# Patient Record
Sex: Male | Born: 1995 | Race: Black or African American | Hispanic: No | Marital: Single | Smoking: Former smoker
Health system: Southern US, Community
[De-identification: ages and names within clinical notes are randomized; demographics above are authoritative.]

## PROBLEM LIST (undated history)

## (undated) DIAGNOSIS — T7840XA Allergy, unspecified, initial encounter: Secondary | ICD-10-CM

## (undated) DIAGNOSIS — J45909 Unspecified asthma, uncomplicated: Secondary | ICD-10-CM

## (undated) HISTORY — DX: Allergy, unspecified, initial encounter: T78.40XA

---
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
## 2019-06-05 ENCOUNTER — Other Ambulatory Visit: Payer: Self-pay | Admitting: *Deleted

## 2019-06-05 DIAGNOSIS — Z20822 Contact with and (suspected) exposure to covid-19: Secondary | ICD-10-CM

## 2019-06-10 LAB — NOVEL CORONAVIRUS, NAA: SARS-CoV-2, NAA: NOT DETECTED

## 2022-07-07 ENCOUNTER — Encounter (HOSPITAL_BASED_OUTPATIENT_CLINIC_OR_DEPARTMENT_OTHER): Payer: Self-pay | Admitting: Emergency Medicine

## 2022-07-07 ENCOUNTER — Emergency Department (HOSPITAL_BASED_OUTPATIENT_CLINIC_OR_DEPARTMENT_OTHER)
Admission: EM | Admit: 2022-07-07 | Discharge: 2022-07-07 | Disposition: A | Discharge status: Home / Self Care | Payer: Self-pay | Attending: Emergency Medicine | Admitting: Emergency Medicine

## 2022-07-07 ENCOUNTER — Other Ambulatory Visit: Payer: Self-pay

## 2022-07-07 DIAGNOSIS — F17210 Nicotine dependence, cigarettes, uncomplicated: Secondary | ICD-10-CM | POA: Insufficient documentation

## 2022-07-07 DIAGNOSIS — R1013 Epigastric pain: Secondary | ICD-10-CM | POA: Insufficient documentation

## 2022-07-07 HISTORY — DX: Unspecified asthma, uncomplicated: J45.909

## 2022-07-07 LAB — COMPREHENSIVE METABOLIC PANEL
ALT: 31 U/L (ref 0–44)
AST: 26 U/L (ref 15–41)
Albumin: 4.8 g/dL (ref 3.5–5.0)
Alkaline Phosphatase: 45 U/L (ref 38–126)
Anion gap: 10 (ref 5–15)
BUN: 15 mg/dL (ref 6–20)
CO2: 27 mmol/L (ref 22–32)
Calcium: 9.8 mg/dL (ref 8.9–10.3)
Chloride: 104 mmol/L (ref 98–111)
Creatinine, Ser: 0.97 mg/dL (ref 0.61–1.24)
GFR, Estimated: >60 mL/min (ref >60)
Glucose, Bld: 93 mg/dL (ref 70–99)
Potassium: 3.6 mmol/L (ref 3.5–5.1)
Sodium: 141 mmol/L (ref 135–145)
Total Bilirubin: 0.6 mg/dL (ref 0.3–1.2)
Total Protein: 8.2 g/dL — ABNORMAL HIGH (ref 6.5–8.1)

## 2022-07-07 LAB — CBC
HCT: 43.9 % (ref 39.0–52.0)
Hemoglobin: 14.2 g/dL (ref 13.0–17.0)
MCH: 25.6 pg — ABNORMAL LOW (ref 26.0–34.0)
MCHC: 32.3 g/dL (ref 30.0–36.0)
MCV: 79.1 fL — ABNORMAL LOW (ref 80.0–100.0)
Platelets: 344 10*3/uL (ref 150–400)
RBC: 5.55 MIL/uL (ref 4.22–5.81)
RDW: 14.8 % (ref 11.5–15.5)
WBC: 7.9 10*3/uL (ref 4.0–10.5)
nRBC: 0 % (ref 0.0–0.2)

## 2022-07-07 LAB — LIPASE, BLOOD: Lipase: 20 U/L (ref 11–51)

## 2022-07-07 MED ORDER — SUCRALFATE 1 G PO TABS: 1.0000 g | ORAL_TABLET | Freq: Three times a day (TID) | ORAL | 0 refills | Status: DC

## 2022-07-07 MED ORDER — ESOMEPRAZOLE MAGNESIUM 40 MG PO CPDR: DELAYED_RELEASE_CAPSULE | ORAL | 0 refills | Status: DC

## 2022-07-07 MED ORDER — ESOMEPRAZOLE MAGNESIUM 40 MG PO CPDR: DELAYED_RELEASE_CAPSULE | ORAL | 0 refills | Status: AC

## 2022-07-07 MED ORDER — PANTOPRAZOLE SODIUM 40 MG PO TBEC
40.0000 mg | DELAYED_RELEASE_TABLET | Freq: Once | ORAL | Status: AC
  Administered 2022-07-07: 40 mg via ORAL
  Filled 2022-07-07: qty 1

## 2022-07-07 NOTE — ED Triage Notes (Signed)
Patient endorses generalized abd pain x 1 week.  Patient denies n/v/d.

## 2022-07-07 NOTE — ED Provider Notes (Signed)
[facility] Provider Note: Lowella Dell, MD, FACEP  CSN: [id number] MRN: [id number] ARRIVAL: 07/07/22 at 0031 ROOM: DB016/DB016   CHIEF COMPLAINT  Abdominal Pain   HISTORY OF PRESENT ILLNESS  07/07/22 1:43 AM Johnny Santos is a 26 y.o. male about a week of epigastric discomfort.  He has difficulty describing it but it is not a frank pain.  He rates his discomfort as a 5 out of 10.  There is no associated nausea, vomiting or diarrhea.  It is not worse or better with food.  It worsened overnight and so he decided come to the ED.   Past Medical History:  Diagnosis Date   Asthma     History reviewed. No pertinent surgical history.  History reviewed. No pertinent family history.  Social History   Tobacco Use   Smoking status: Some Days    Types: Cigars   Smokeless tobacco: Never  Substance Use Topics   Alcohol use: Yes   Drug use: Never    Prior to Admission medications   Medication Sig Start Date End Date Taking? Authorizing Provider  esomeprazole (NEXIUM) 40 MG capsule Take 1 capsule daily at least 30 minutes before first dose of Carafate. 07/07/22  Yes Tresean Mattix, MD  sucralfate (CARAFATE) 1 g tablet Take 1 tablet (1 g total) by mouth 4 (four) times daily -  with meals and at bedtime. 07/07/22  Yes Isatu Macinnes, MD    Allergies Pollen extract   REVIEW OF SYSTEMS  Negative except as noted here or in the History of Present Illness.   PHYSICAL EXAMINATION  Initial Vital Signs Blood pressure (!) 156/106, pulse 95, temperature 99.4 F (37.4 C), temperature source Oral, resp. rate 20, height 6\' 2"  (1.88 m), weight (!) 142.9 kg, SpO2 100 %.  Examination General: Well-developed, well-nourished male in no acute distress; appearance consistent with age of record HENT: normocephalic; atraumatic Eyes: Normal appearance Neck: supple Heart: regular rate and rhythm Lungs: clear to auscultation bilaterally Abdomen: soft; nondistended; minimal left upper quadrant  tenderness; bowel sounds present Extremities: No deformity; full range of motion; pulses normal Neurologic: Awake, alert and oriented; motor function intact in all extremities and symmetric; no facial droop Skin: Warm and dry Psychiatric: Normal mood and affect   RESULTS  Summary of this visit's results, reviewed and interpreted by myself:   EKG Interpretation  Date/Time:    Ventricular Rate:    PR Interval:    QRS Duration:   QT Interval:    QTC Calculation:   R Axis:     Text Interpretation:         Laboratory Studies: Results for orders placed or performed during the hospital encounter of 07/07/22 (from the past 24 hour(s))  Lipase, blood     Status: None   Collection Time: 07/07/22 12:50 AM  Result Value Ref Range   Lipase 20 11 - 51 U/L  Comprehensive metabolic panel     Status: Abnormal   Collection Time: 07/07/22 12:50 AM  Result Value Ref Range   Sodium 141 135 - 145 mmol/L   Potassium 3.6 3.5 - 5.1 mmol/L   Chloride 104 98 - 111 mmol/L   CO2 27 22 - 32 mmol/L   Glucose, Bld 93 70 - 99 mg/dL   BUN 15 6 - 20 mg/dL   Creatinine, Ser 09/06/22 0.61 - 1.24 mg/dL   Calcium 9.8 8.9 - 2.44 mg/dL   Total Protein 8.2 (H) 6.5 - 8.1 g/dL   Albumin 4.8 3.5 -  5.0 g/dL   AST 26 15 - 41 U/L   ALT 31 0 - 44 U/L   Alkaline Phosphatase 45 38 - 126 U/L   Total Bilirubin 0.6 0.3 - 1.2 mg/dL   GFR, Estimated >11 >91 mL/min   Anion gap 10 5 - 15  CBC     Status: Abnormal   Collection Time: 07/07/22 12:50 AM  Result Value Ref Range   WBC 7.9 4.0 - 10.5 K/uL   RBC 5.55 4.22 - 5.81 MIL/uL   Hemoglobin 14.2 13.0 - 17.0 g/dL   HCT 47.8 29.5 - 62.1 %   MCV 79.1 (L) 80.0 - 100.0 fL   MCH 25.6 (L) 26.0 - 34.0 pg   MCHC 32.3 30.0 - 36.0 g/dL   RDW 30.8 65.7 - 84.6 %   Platelets 344 150 - 400 K/uL   nRBC 0.0 0.0 - 0.2 %   Imaging Studies: No results found.  ED COURSE and MDM  Nursing notes, initial and subsequent vitals signs, including pulse oximetry, reviewed and interpreted  by myself.  Vitals:   07/07/22 0045 07/07/22 0145  BP: (!) 156/106 (!) 141/92  Pulse: 95 80  Resp: 20 18  Temp: 99.4 F (37.4 C)   TempSrc: Oral   SpO2: 100% 97%  Weight: (!) 142.9 kg   Height: 6\' 2"  (1.88 m)    Medications  pantoprazole (PROTONIX) EC tablet 40 mg (has no administration in time range)   The patient has minimal left upper quadrant tenderness and unremarkable labs (he was advised of slightly low MCV and encouraged to increase his iron intake).  I suspect his symptoms are due to gastritis and/or GERD.  Will start him on a PPI and Carafate.   PROCEDURES  Procedures   ED DIAGNOSES     ICD-10-CM   1. Epigastric abdominal pain  R10.13          Johnny Santos, , MD 07/07/22 [phone]

## 2022-11-23 ENCOUNTER — Ambulatory Visit
Admission: EM | Admit: 2022-11-23 | Discharge: 2022-11-23 | Disposition: A | Discharge status: Home / Self Care | Payer: Self-pay | Attending: Urgent Care | Admitting: Urgent Care

## 2022-11-23 DIAGNOSIS — R519 Headache, unspecified: Secondary | ICD-10-CM

## 2022-11-23 DIAGNOSIS — G8929 Other chronic pain: Secondary | ICD-10-CM

## 2022-11-23 MED ORDER — FLUTICASONE PROPIONATE 50 MCG/ACT NA SUSP: 2.0000 | Freq: Every day | NASAL | 12 refills | Status: DC

## 2022-11-23 MED ORDER — CETIRIZINE HCL 10 MG PO TABS: 10.0000 mg | ORAL_TABLET | Freq: Every day | ORAL | 0 refills | Status: AC

## 2022-11-23 MED ORDER — PSEUDOEPHEDRINE HCL 60 MG PO TABS: 60.0000 mg | ORAL_TABLET | Freq: Three times a day (TID) | ORAL | 0 refills | Status: DC | PRN

## 2022-11-23 NOTE — ED Triage Notes (Signed)
Pt c/o HA "most of my life"-had HA and vomited x 2 on 12/24-felt better after vomiting states he felt better-states family checked BP "150s"-denies hx of dx migraine and HTN-NAD-steady gait

## 2022-11-23 NOTE — ED Provider Notes (Signed)
[facility]  Note:  This document was prepared using Conservation officer, historic buildings and may include unintentional dictation errors.  MRN: [id number] DOB: November 13, 1996  Subjective:   Johnny Santos is a 26 y.o. male presenting for 3-4 day history of acute on chronic persistent intermittent headache that elicited vomiting. Has longstanding history of headaches. Currently has right frontal headache, sinus congestion, drainage. He is mostly concerned because on 11/19/2022 he had a severe headache and checked his blood pressure which was consistently in the 140s-150s systolic. No history of HTN. Does not take blood pressure medications. Has never seen a headache specialist, neurologist. Does not hydrate well. Generally eats regular meals. Sleeps 6-8 hours a night consistently.   No current facility-administered medications for this encounter.  Current Outpatient Medications:    esomeprazole (NEXIUM) 40 MG capsule, Take 1 capsule daily at least 30 minutes before first dose of Carafate., Disp: 30 capsule, Rfl: 0   sucralfate (CARAFATE) 1 g tablet, Take 1 tablet (1 g total) by mouth 4 (four) times daily -  with meals and at bedtime., Disp: 40 tablet, Rfl: 0   Allergies  Allergen Reactions   Pollen Extract     Past Medical History:  Diagnosis Date   Asthma      History reviewed. No pertinent surgical history.  No family history on file.  Social History   Tobacco Use   Smoking status: Some Days    Types: Cigars   Smokeless tobacco: Never  Vaping Use   Vaping Use: Never used  Substance Use Topics   Alcohol use: Yes    Comment: occ   Drug use: Never    ROS   Objective:   Vitals: BP 117/86 (BP Location: Left Arm)   Pulse 82   Temp 98.5 F (36.9 C) (Oral)   Resp 18   SpO2 95%   Physical Exam Constitutional:      General: He is not in acute distress.    Appearance: Normal appearance. He is well-developed and normal weight. He is not ill-appearing,  toxic-appearing or diaphoretic.  HENT:     Head: Normocephalic and atraumatic.     Right Ear: Tympanic membrane, ear canal and external ear normal. No drainage, swelling or tenderness. No middle ear effusion. There is no impacted cerumen. Tympanic membrane is not erythematous or bulging.     Left Ear: Tympanic membrane, ear canal and external ear normal. No drainage, swelling or tenderness.  No middle ear effusion. There is no impacted cerumen. Tympanic membrane is not erythematous or bulging.     Nose: Nose normal. No congestion or rhinorrhea.     Mouth/Throat:     Mouth: Mucous membranes are moist.     Pharynx: Oropharynx is clear. No oropharyngeal exudate or posterior oropharyngeal erythema.  Eyes:     General: No scleral icterus.       Right eye: No discharge.        Left eye: No discharge.     Extraocular Movements: Extraocular movements intact.     Conjunctiva/sclera: Conjunctivae normal.  Neck:     Meningeal: Brudzinski's sign and Kernig's sign absent.  Cardiovascular:     Rate and Rhythm: Normal rate.  Pulmonary:     Effort: Pulmonary effort is normal.  Musculoskeletal:     Cervical back: Normal range of motion and neck supple. No swelling, edema, deformity, erythema, signs of trauma, lacerations, rigidity, spasms, torticollis, tenderness, bony tenderness or crepitus. No pain with movement or muscular tenderness.  Normal range of motion.  Neurological:     General: No focal deficit present.     Mental Status: He is alert and oriented to person, place, and time.     Cranial Nerves: No cranial nerve deficit, dysarthria or facial asymmetry.     Motor: No weakness, abnormal muscle tone or pronator drift.     Coordination: Romberg sign negative. Coordination normal. Finger-Nose-Finger Test and Heel to [facility] Test normal. Rapid alternating movements normal.     Gait: Gait normal.     Deep Tendon Reflexes: Reflexes normal.  Psychiatric:        Mood and Affect: Mood normal.         Behavior: Behavior normal.        Thought Content: Thought content normal.        Judgment: Judgment normal.     Assessment and Plan :   PDMP not reviewed this encounter.  1. Chronic nonintractable headache, unspecified headache type     Recommended conservative management.  Will have him start Flonase, Zyrtec and pseudoephedrine as needed to address sinus headaches.  He has some elements of migraine but will hold off on migraine medications.  Prefer that he has a complete evaluation with neurology and/or the headache clinic.  No signs of an acute encephalopathy.  Given the chronic nature of his headaches and a very mild sinus headache in clinic today will defer an ER visit. Counseled patient on potential for adverse effects with medications prescribed/recommended today, ER and return-to-clinic precautions discussed, patient verbalized understanding.    Wallis Bamberg, [place] 11/23/22 [phone]

## 2022-11-29 ENCOUNTER — Encounter (HOSPITAL_BASED_OUTPATIENT_CLINIC_OR_DEPARTMENT_OTHER): Payer: Self-pay

## 2022-11-29 DIAGNOSIS — R197 Diarrhea, unspecified: Secondary | ICD-10-CM | POA: Insufficient documentation

## 2022-11-29 DIAGNOSIS — R1084 Generalized abdominal pain: Secondary | ICD-10-CM | POA: Insufficient documentation

## 2022-11-29 LAB — URINALYSIS, ROUTINE W REFLEX MICROSCOPIC
Bilirubin Urine: NEGATIVE
Glucose, UA: NEGATIVE mg/dL
Hgb urine dipstick: NEGATIVE
Ketones, ur: 15 mg/dL — AB
Leukocytes,Ua: NEGATIVE
Nitrite: NEGATIVE
Specific Gravity, Urine: 1.035 — ABNORMAL HIGH (ref 1.005–1.030)
pH: 5.5 (ref 5.0–8.0)

## 2022-11-29 LAB — COMPREHENSIVE METABOLIC PANEL
ALT: 28 U/L (ref 0–44)
AST: 19 U/L (ref 15–41)
Albumin: 4.9 g/dL (ref 3.5–5.0)
Alkaline Phosphatase: 46 U/L (ref 38–126)
Anion gap: 13 (ref 5–15)
BUN: 20 mg/dL (ref 6–20)
CO2: 26 mmol/L (ref 22–32)
Calcium: 10.1 mg/dL (ref 8.9–10.3)
Chloride: 103 mmol/L (ref 98–111)
Creatinine, Ser: 0.96 mg/dL (ref 0.61–1.24)
GFR, Estimated: >60 mL/min (ref >60)
Glucose, Bld: 85 mg/dL (ref 70–99)
Potassium: 3.9 mmol/L (ref 3.5–5.1)
Sodium: 142 mmol/L (ref 135–145)
Total Bilirubin: 0.8 mg/dL (ref 0.3–1.2)
Total Protein: 8.3 g/dL — ABNORMAL HIGH (ref 6.5–8.1)

## 2022-11-29 LAB — CBC
HCT: 44.3 % (ref 39.0–52.0)
Hemoglobin: 14 g/dL (ref 13.0–17.0)
MCH: 25.1 pg — ABNORMAL LOW (ref 26.0–34.0)
MCHC: 31.6 g/dL (ref 30.0–36.0)
MCV: 79.5 fL — ABNORMAL LOW (ref 80.0–100.0)
Platelets: 348 10*3/uL (ref 150–400)
RBC: 5.57 MIL/uL (ref 4.22–5.81)
RDW: 15.2 % (ref 11.5–15.5)
WBC: 9 10*3/uL (ref 4.0–10.5)
nRBC: 0 % (ref 0.0–0.2)

## 2022-11-29 LAB — LIPASE, BLOOD: Lipase: 24 U/L (ref 11–51)

## 2022-11-29 NOTE — ED Triage Notes (Signed)
Patient presents from home, states he has had abd pain w ND for the past week. States he is more so uncomfortable than in pain, CC is pain in sides that rads to abd, states he feels like he is constantly full. Patient aaxo4, ambulatory, NAD.

## 2022-11-30 ENCOUNTER — Emergency Department (HOSPITAL_BASED_OUTPATIENT_CLINIC_OR_DEPARTMENT_OTHER)
Admission: EM | Admit: 2022-11-30 | Discharge: 2022-11-30 | Disposition: A | Discharge status: Home / Self Care | Payer: Self-pay | Attending: Emergency Medicine | Admitting: Emergency Medicine

## 2022-11-30 DIAGNOSIS — R197 Diarrhea, unspecified: Secondary | ICD-10-CM

## 2022-11-30 DIAGNOSIS — R1084 Generalized abdominal pain: Secondary | ICD-10-CM

## 2022-11-30 NOTE — Discharge Instructions (Signed)

## 2022-11-30 NOTE — ED Provider Notes (Signed)
[facility] EMERGENCY DEPT Provider Note   CSN: [id number] Arrival date & time: 11/29/22  1958     History  Chief Complaint  Patient presents with   Abdominal Pain    Johnny Santos is a 27 y.o. male.  The history is provided by the patient and a parent.  Abdominal Pain Pain location:  Generalized Pain quality: aching and bloating   Pain severity:  Moderate Onset quality:  Gradual Progression:  Improving Chronicity:  New Worsened by:  Nothing Associated symptoms: diarrhea and nausea   Associated symptoms: no chest pain, no dysuria, no fever, no hematochezia and no vomiting   Risk factors: has not had multiple surgeries   Patient reports for the past 3 days he has had generalized abdominal pain and diarrhea.  He reports about 2 days ago he had 10-15 trips to the bathroom with diarrhea and gas.  It was nonbloody.  No vomiting.  No recent travel.  No antibiotics.  No known sick contacts. No previous abdominal surgeries.  While he has been waiting he is feeling improved.  He has been eating beef jerky without difficulty     Home Medications Prior to Admission medications   Medication Sig Start Date End Date Taking? Authorizing Provider  cetirizine (ZYRTEC ALLERGY) 10 MG tablet Take 1 tablet (10 mg total) by mouth daily. 11/23/22   Jaynee Eagles, PA-C  esomeprazole (NEXIUM) 40 MG capsule Take 1 capsule daily at least 30 minutes before first dose of Carafate. 07/07/22   Molpus, John, MD  fluticasone (FLONASE) 50 MCG/ACT nasal spray Place 2 sprays into both nostrils daily. 11/23/22   Jaynee Eagles, PA-C  sucralfate (CARAFATE) 1 g tablet Take 1 tablet (1 g total) by mouth 4 (four) times daily -  with meals and at bedtime. 07/07/22   Molpus, John, MD      Allergies    Pollen extract    Review of Systems   Review of Systems  Constitutional:  Negative for fever.  Cardiovascular:  Negative for chest pain.  Gastrointestinal:  Positive for abdominal pain, diarrhea and nausea.  Negative for hematochezia and vomiting.  Genitourinary:  Negative for dysuria.    Physical Exam Updated Vital Signs BP 116/81   Pulse 64   Temp 98.6 F (37 C) (Oral)   Resp 18   Ht 1.88 m (6\' 2" )   Wt (!) 170.5 kg   SpO2 97%   BMI 48.25 kg/m  Physical Exam CONSTITUTIONAL: Well developed/well nourished, no distress HEAD: Normocephalic/atraumatic EYES: EOMI/PERRL, no icterus ENMT: Mucous membranes moist NECK: supple no meningeal signs SPINE/BACK:entire spine nontender CV: S1/S2 noted, no murmurs/rubs/gallops noted LUNGS: Lungs are clear to auscultation bilaterally, no apparent distress ABDOMEN: soft, nontender, no rebound or guarding, bowel sounds noted throughout abdomen, obese GU:no cva tenderness NEURO: Pt is awake/alert/appropriate, moves all extremitiesx4.  No facial droop.   EXTREMITIES: pulses normal/equal, full ROM SKIN: warm, color normal PSYCH: no abnormalities of mood noted, alert and oriented to situation  ED Results / Procedures / Treatments   Labs (all labs ordered are listed, but only abnormal results are displayed) Labs Reviewed  COMPREHENSIVE METABOLIC PANEL - Abnormal; Notable for the following components:      Result Value   Total Protein 8.3 (*)    All other components within normal limits  CBC - Abnormal; Notable for the following components:   MCV 79.5 (*)    MCH 25.1 (*)    All other components within normal limits  URINALYSIS, ROUTINE W REFLEX  MICROSCOPIC - Abnormal; Notable for the following components:   Specific Gravity, Urine 1.035 (*)    Ketones, ur 15 (*)    Protein, ur TRACE (*)    All other components within normal limits  LIPASE, BLOOD    EKG None  Radiology No results found.  Procedures Procedures    Medications Ordered in ED Medications - No data to display  ED Course/ Medical Decision Making/ A&P                           Medical Decision Making Amount and/or Complexity of Data Reviewed Labs: ordered.   This  patient presents to the ED for concern of abdominal pain, this involves an extensive number of treatment options, and is a complaint that carries with it a high risk of complications and morbidity.  The differential diagnosis includes but is not limited to cholecystitis, cholelithiasis, pancreatitis, gastritis, peptic ulcer disease, appendicitis, bowel obstruction, bowel perforation, diverticulitis, AAA, ischemic bowel    Comorbidities that complicate the patient evaluation: Patient's presentation is complicated by their history of obesity  Social Determinants of Health: Patient's lack of insurance  increases the complexity of managing their presentation  Additional history obtained: Additional history obtained from family Records reviewed  urgent care records reviewed  Lab Tests: I Ordered, and personally interpreted labs.  The pertinent results include: Labs reveal mild dehydration   Test Considered: I considered CT abdomen pelvis, patient feels improved and like to be discharged   Complexity of problems addressed: Patient's presentation is most consistent with  acute presentation with potential threat to life or bodily function  Disposition: After consideration of the diagnostic results and the patient's response to treatment,  I feel that the patent would benefit from discharge   .           Final Clinical Impression(s) / ED Diagnoses Final diagnoses:  Generalized abdominal pain  Diarrhea of presumed infectious origin    Rx / DC Orders ED Discharge Orders     None         Ripley Fraise, MD 11/30/22 [phone]

## 2022-12-07 ENCOUNTER — Encounter: Payer: Self-pay | Admitting: Neurology

## 2023-01-07 ENCOUNTER — Ambulatory Visit
Admission: EM | Admit: 2023-01-07 | Discharge: 2023-01-07 | Disposition: A | Discharge status: Home / Self Care | Payer: BLUE CROSS/BLUE SHIELD

## 2023-01-07 DIAGNOSIS — J4521 Mild intermittent asthma with (acute) exacerbation: Secondary | ICD-10-CM

## 2023-01-07 DIAGNOSIS — J069 Acute upper respiratory infection, unspecified: Secondary | ICD-10-CM | POA: Diagnosis not present

## 2023-01-07 MED ORDER — PREDNISONE 20 MG PO TABS: 40.0000 mg | ORAL_TABLET | Freq: Every day | ORAL | 0 refills | Status: AC

## 2023-01-07 MED ORDER — PROMETHAZINE-DM 6.25-15 MG/5ML PO SYRP: 5.0000 mL | ORAL_SOLUTION | Freq: Four times a day (QID) | ORAL | 0 refills | Status: DC | PRN

## 2023-01-07 MED ORDER — ALBUTEROL SULFATE HFA 108 (90 BASE) MCG/ACT IN AERS: 1.0000 | INHALATION_SPRAY | Freq: Four times a day (QID) | RESPIRATORY_TRACT | 0 refills | Status: DC | PRN

## 2023-01-07 NOTE — Discharge Instructions (Signed)
Albuterol inhaler as needed Prednisone daily for 5 days Promethazine DM cough syrup as needed for cough.  Please note this medication can make you drowsy.  Do not drink alcohol or drive while on this medication Rest and fluids Follow-up with your PCP 2 days for recheck Please go to the emergency room if you have any worsening symptoms

## 2023-01-07 NOTE — ED Provider Notes (Signed)
[facility]    CSN: [id number] Arrival date & time: 01/07/23  [id number]      History   Chief Complaint Chief Complaint  Patient presents with   Cough   Nasal Congestion    HPI Johnny Santos is a 27 y.o. male  who presents for evaluation of URI symptoms for 3-4 days. Patient reports associated symptoms of cough, congestion, sore throat, subjective fevers, wheezing. Denies N/V/D, ear pain, body aches, shortness of breath. Patient does have a hx of asthma.  Is been using a friend's albuterol inhaler.  No he does want any he is an occasional cigar smoker.  No known sick contacts and no recent travel. Pt has taken TheraFlu and Sudafed OTC for symptoms. Pt has no other concerns at this time.    Cough Associated symptoms: fever and wheezing     Past Medical History:  Diagnosis Date   Asthma     There are no problems to display for this patient.   History reviewed. No pertinent surgical history.     Home Medications    Prior to Admission medications   Medication Sig Start Date End Date Taking? Authorizing Provider  albuterol (VENTOLIN HFA) 108 (90 Base) MCG/ACT inhaler Inhale 1-2 puffs into the lungs every 6 (six) hours as needed for wheezing or shortness of breath. 01/07/23  Yes Melynda Ripple, NP  predniSONE (DELTASONE) 20 MG tablet Take 2 tablets (40 mg total) by mouth daily with breakfast for 5 days. 01/07/23 01/12/23 Yes Melynda Ripple, NP  promethazine-dextromethorphan (PROMETHAZINE-DM) 6.25-15 MG/5ML syrup Take 5 mLs by mouth 4 (four) times daily as needed for cough. 01/07/23  Yes Melynda Ripple, NP  pseudoephedrine (SUDAFED) 30 MG tablet Take 30 mg by mouth every 4 (four) hours as needed for congestion.   Yes [provider]  cetirizine (ZYRTEC ALLERGY) 10 MG tablet Take 1 tablet (10 mg total) by mouth daily. 11/23/22   Jaynee Eagles, PA-C  esomeprazole (NEXIUM) 40 MG capsule Take 1 capsule daily at least 30 minutes before first dose of Carafate. 07/07/22   Molpus,  John, MD  fluticasone (FLONASE) 50 MCG/ACT nasal spray Place 2 sprays into both nostrils daily. 11/23/22   Jaynee Eagles, PA-C  sucralfate (CARAFATE) 1 g tablet Take 1 tablet (1 g total) by mouth 4 (four) times daily -  with meals and at bedtime. 07/07/22   Molpus, Jenny Reichmann, MD    Family History History reviewed. No pertinent family history.  Social History Social History   Tobacco Use   Smoking status: Some Days    Types: Cigars   Smokeless tobacco: Never  Vaping Use   Vaping Use: Never used  Substance Use Topics   Alcohol use: Yes    Comment: occ   Drug use: Never     Allergies   Pollen extract   Review of Systems Review of Systems  Constitutional:  Positive for fever.  HENT:  Positive for congestion.   Respiratory:  Positive for cough and wheezing.      Physical Exam Triage Vital Signs ED Triage Vitals  Enc Vitals Group     BP 01/07/23 0905 (!) 144/87     Pulse Rate 01/07/23 0905 93     Resp 01/07/23 0905 18     Temp 01/07/23 0905 98.4 F (36.9 C)     Temp Source 01/07/23 0905 Oral     SpO2 01/07/23 0905 95 %     Weight --      Height --  Head Circumference --      Peak Flow --      Pain Score 01/07/23 0902 2     Pain Loc --      Pain Edu? --      Excl. in [place]? --    No data found.  Updated Vital Signs BP (!) 144/87 (BP Location: Left Arm)   Pulse 93   Temp 98.4 F (36.9 C) (Oral)   Resp 18   SpO2 95%   Visual Acuity Right Eye Distance:   Left Eye Distance:   Bilateral Distance:    Right Eye Near:   Left Eye Near:    Bilateral Near:     Physical Exam Vitals and nursing note reviewed.  Constitutional:      General: He is not in acute distress.    Appearance: Normal appearance. He is not ill-appearing or toxic-appearing.  HENT:     Head: Normocephalic and atraumatic.     Right Ear: Tympanic membrane and ear canal normal.     Left Ear: Tympanic membrane and ear canal normal.     Nose: Congestion present.     Mouth/Throat:     Mouth:  Mucous membranes are moist.     Pharynx: Posterior oropharyngeal erythema present.  Eyes:     Pupils: Pupils are equal, round, and reactive to light.  Cardiovascular:     Rate and Rhythm: Normal rate and regular rhythm.     Heart sounds: Normal heart sounds.  Pulmonary:     Effort: Pulmonary effort is normal.     Breath sounds: Normal breath sounds. No wheezing.  Musculoskeletal:     Cervical back: Normal range of motion and neck supple.  Lymphadenopathy:     Cervical: No cervical adenopathy.  Skin:    General: Skin is warm and dry.  Neurological:     General: No focal deficit present.     Mental Status: He is alert and oriented to person, place, and time.  Psychiatric:        Mood and Affect: Mood normal.        Behavior: Behavior normal.      UC Treatments / Results  Labs (all labs ordered are listed, but only abnormal results are displayed) Labs Reviewed - No data to display  EKG   Radiology No results found.  Procedures Procedures (including critical care time)  Medications Ordered in UC Medications - No data to display  Initial Impression / Assessment and Plan / UC Course  I have reviewed the triage vital signs and the nursing notes.  Pertinent labs & imaging results that were available during my care of the patient were reviewed by me and considered in my medical decision making (see chart for details).     Reviewed exam and symptoms with patient.  No red flags on exam. Patient declined COVID PCR stating he had a negative home test Discussed viral upper respiratory infection and symptomatic treatment Albuterol inhaler prescribed as patient does not have his own Prednisone daily for 5 days Promethazine DM as needed cough.  Side effect profile reviewed and patient verbalized understanding Rest and fluids PCP follow-up 2 to 3 days for recheck ER precautions reviewed and patient verbalized understanding Final Clinical Impressions(s) / UC Diagnoses    Final diagnoses:  Acute upper respiratory infection  Mild intermittent asthma with acute exacerbation     Discharge Instructions      Albuterol inhaler as needed Prednisone daily for 5 days Promethazine DM cough syrup as  needed for cough.  Please note this medication can make you drowsy.  Do not drink alcohol or drive while on this medication Rest and fluids Follow-up with your PCP 2 days for recheck Please go to the emergency room if you have any worsening symptoms   ED Prescriptions     Medication Sig Dispense Auth. Provider   albuterol (VENTOLIN HFA) 108 (90 Base) MCG/ACT inhaler Inhale 1-2 puffs into the lungs every 6 (six) hours as needed for wheezing or shortness of breath. 1 each Melynda Ripple, NP   predniSONE (DELTASONE) 20 MG tablet Take 2 tablets (40 mg total) by mouth daily with breakfast for 5 days. 10 tablet Melynda Ripple, NP   promethazine-dextromethorphan (PROMETHAZINE-DM) 6.25-15 MG/5ML syrup Take 5 mLs by mouth 4 (four) times daily as needed for cough. 118 mL Melynda Ripple, NP      PDMP not reviewed this encounter.   Melynda Ripple, NP 01/07/23 1001

## 2023-01-07 NOTE — ED Triage Notes (Addendum)
Pt states for the past 3-4 day he has been having congestion, cough, and SHOB.   Home interventions: OTC cough medication   At home covid test negative 1-2 days ago.

## 2023-02-20 NOTE — Progress Notes (Deleted)
NEUROLOGY CONSULTATION NOTE  Johnny Santos MRN: [id number] DOB: 05-29-1996  Referring provider: Jaynee Eagles, PA-C (Urgent Care referral) Primary care provider: ***  Reason for consult:  headache  Assessment/Plan:   ***   Subjective:  Johnny Santos is a 27 year old male with asthma who presents for headache.  History supplemented by Urgent Care note.  Onset:  *** Location:  *** Quality:  *** Intensity:  ***.   Aura:  *** Prodrome:  *** Associated symptoms:  ***.  He denies associated unilateral numbness or weakness. Duration:  *** Frequency:  *** Frequency of abortive medication: *** Triggers:  *** Relieving factors:  *** Activity:  ***  He had a particularly severe headache on 11/19/2022.  Systolic blood pressure was in the 140s-150s.  No history of hypertension.  He went to Urgent Care.  Blood pressure was 117/86.    Past NSAIDS/analgesics:  *** Past abortive triptans:  *** Past abortive ergotamine:  none Past muscle relaxants:  none Past anti-emetic:  none Past antihypertensive medications:  none Past antidepressant medications:  none Past anticonvulsant medications:  none Past anti-CGRP:  none Past vitamins/Herbal/Supplements:  none Past antihistamines/decongestants:  Benadryl Other past therapies:  ***  Current NSAIDS/analgesics:  *** Current triptans:  none Current ergotamine:  none Current anti-emetic:  none Current muscle relaxants:  none Current Antihypertensive medications:  none Current Antidepressant medications:  none Current Anticonvulsant medications:  none Current anti-CGRP:  none Current Vitamins/Herbal/Supplements:  none Current Antihistamines/Decongestants:  Zyrtec, Flonase, Promethazine-DM, Sudafed Other therapy:  *** Other medications:  albuterol, Nexium   Caffeine:  *** Diet:  Typically does not drink enough water Exercise:  *** Depression:  ***; Anxiety:  *** Other pain:  *** Sleep hygiene:  sleeps 6-8 hours a night Family  history of headache:  ***      PAST MEDICAL HISTORY: Past Medical History:  Diagnosis Date   Asthma     PAST SURGICAL HISTORY: No past surgical history on file.  MEDICATIONS: Current Outpatient Medications on File Prior to Visit  Medication Sig Dispense Refill   albuterol (VENTOLIN HFA) 108 (90 Base) MCG/ACT inhaler Inhale 1-2 puffs into the lungs every 6 (six) hours as needed for wheezing or shortness of breath. 1 each 0   cetirizine (ZYRTEC ALLERGY) 10 MG tablet Take 1 tablet (10 mg total) by mouth daily. 90 tablet 0   esomeprazole (NEXIUM) 40 MG capsule Take 1 capsule daily at least 30 minutes before first dose of Carafate. 30 capsule 0   fluticasone (FLONASE) 50 MCG/ACT nasal spray Place 2 sprays into both nostrils daily. 16 g 12   promethazine-dextromethorphan (PROMETHAZINE-DM) 6.25-15 MG/5ML syrup Take 5 mLs by mouth 4 (four) times daily as needed for cough. 118 mL 0   pseudoephedrine (SUDAFED) 30 MG tablet Take 30 mg by mouth every 4 (four) hours as needed for congestion.     sucralfate (CARAFATE) 1 g tablet Take 1 tablet (1 g total) by mouth 4 (four) times daily -  with meals and at bedtime. 40 tablet 0   No current facility-administered medications on file prior to visit.    ALLERGIES: Allergies  Allergen Reactions   Pollen Extract     FAMILY HISTORY: No family history on file.  Objective:  *** General: No acute distress.  Patient appears well-groomed.   Head:  Normocephalic/atraumatic Eyes:  fundi examined but not visualized Neck: supple, no paraspinal tenderness, full range of motion Back: No paraspinal tenderness Heart: regular rate and rhythm Lungs: Clear to auscultation  bilaterally. Vascular: No carotid bruits. Neurological Exam: Mental status: alert and oriented to person, place, and time, speech fluent and not dysarthric, language intact. Cranial nerves: CN I: not tested CN II: pupils equal, round and reactive to light, visual fields intact CN III,  IV, VI:  full range of motion, no nystagmus, no ptosis CN V: facial sensation intact. CN VII: upper and lower face symmetric CN VIII: hearing intact CN IX, X: gag intact, uvula midline CN XI: sternocleidomastoid and trapezius muscles intact CN XII: tongue midline Bulk & Tone: normal, no fasciculations. Motor:  muscle strength 5/5 throughout Sensation:  Pinprick, temperature and vibratory sensation intact. Deep Tendon Reflexes:  2+ throughout,  toes downgoing.   Finger to nose testing:  Without dysmetria.   Heel to shin:  Without dysmetria.   Gait:  Normal station and stride.  Romberg negative.    Thank you for allowing me to take part in the care of this patient.  Metta Clines, DO

## 2023-02-21 ENCOUNTER — Ambulatory Visit: Payer: BLUE CROSS/BLUE SHIELD | Admitting: Neurology

## 2023-05-22 NOTE — Progress Notes (Deleted)
NEUROLOGY CONSULTATION NOTE  Johnny Santos MRN: [id number] DOB: [birth year]  Referring provider: Wallis Bamberg, PA-C (Urgent Care) Primary care provider: No PCP  Reason for consult:  headache  Assessment/Plan:   ***   Subjective:  Johnny Santos is a 27 year old male with asthma who presents for headache.  History supplemented by referring provider's note.  Onset:  *** Location:  right frontal Quality:  *** Intensity:  ***.   Aura:  absent Prodrome:  absent Postdrome:  absent Associated symptoms:  nausea, vomiting, ***.  He denies associated unilateral numbness or weakness. Duration:  *** Frequency:  *** Frequency of abortive medication: *** Triggers:  *** Relieving factors:  *** Activity:  ***  He was seen in Urgent Care in December for a particularly severe headache that had been ongoing for 3-4 days.  Due to associated nasal congestion and drainage, sinus headache was suspected and he was given Flonase and Zyrtec.  ***  Past NSAIDS/analgesics:  *** Past abortive triptans:  *** Past abortive ergotamine:  *** Past muscle relaxants:  *** Past anti-emetic:  *** Past antihypertensive medications:  *** Past antidepressant medications:  *** Past anticonvulsant medications:  *** Past anti-CGRP:  *** Past vitamins/Herbal/Supplements:  *** Past antihistamines/decongestants:  Flonase, Zyrtec Other past therapies:  ***  Current NSAIDS/analgesics:  *** Current triptans:  *** Current ergotamine:  *** Current anti-emetic:  *** Current muscle relaxants:  *** Current Antihypertensive medications:  *** Current Antidepressant medications:  *** Current Anticonvulsant medications:  *** Current anti-CGRP:  *** Current Vitamins/Herbal/Supplements:  *** Current Antihistamines/Decongestants:  *** Other therapy:  *** Birth control:  *** Other medications:  ***   Caffeine:  *** Alcohol:  *** Smoker:  *** Diet:  *** Exercise:  *** Depression:  ***; Anxiety:  *** Other pain:   *** Sleep hygiene:  *** Family history of headache:  ***      PAST MEDICAL HISTORY: Past Medical History:  Diagnosis Date   Asthma     PAST SURGICAL HISTORY: No past surgical history on file.  MEDICATIONS: Current Outpatient Medications on File Prior to Visit  Medication Sig Dispense Refill   albuterol (VENTOLIN HFA) 108 (90 Base) MCG/ACT inhaler Inhale 1-2 puffs into the lungs every 6 (six) hours as needed for wheezing or shortness of breath. 1 each 0   cetirizine (ZYRTEC ALLERGY) 10 MG tablet Take 1 tablet (10 mg total) by mouth daily. 90 tablet 0   esomeprazole (NEXIUM) 40 MG capsule Take 1 capsule daily at least 30 minutes before first dose of Carafate. 30 capsule 0   fluticasone (FLONASE) 50 MCG/ACT nasal spray Place 2 sprays into both nostrils daily. 16 g 12   promethazine-dextromethorphan (PROMETHAZINE-DM) 6.25-15 MG/5ML syrup Take 5 mLs by mouth 4 (four) times daily as needed for cough. 118 mL 0   pseudoephedrine (SUDAFED) 30 MG tablet Take 30 mg by mouth every 4 (four) hours as needed for congestion.     sucralfate (CARAFATE) 1 g tablet Take 1 tablet (1 g total) by mouth 4 (four) times daily -  with meals and at bedtime. 40 tablet 0   No current facility-administered medications on file prior to visit.    ALLERGIES: Allergies  Allergen Reactions   Pollen Extract     FAMILY HISTORY: No family history on file.  Objective:  *** General: No acute distress.  Patient appears well-groomed.   Head:  Normocephalic/atraumatic Eyes:  fundi examined but not visualized Neck: supple, no paraspinal tenderness, full range of motion Back: No  paraspinal tenderness Heart: regular rate and rhythm Lungs: Clear to auscultation bilaterally. Vascular: No carotid bruits. Neurological Exam: Mental status: alert and oriented to person, place, and time, speech fluent and not dysarthric, language intact. Cranial nerves: CN I: not tested CN II: pupils equal, round and reactive to  light, visual fields intact CN III, IV, VI:  full range of motion, no nystagmus, no ptosis CN V: facial sensation intact. CN VII: upper and lower face symmetric CN VIII: hearing intact CN IX, X: gag intact, uvula midline CN XI: sternocleidomastoid and trapezius muscles intact CN XII: tongue midline Bulk & Tone: normal, no fasciculations. Motor:  muscle strength 5/5 throughout Sensation:  Pinprick, temperature and vibratory sensation intact. Deep Tendon Reflexes:  2+ throughout,  toes downgoing.   Finger to nose testing:  Without dysmetria.   Heel to shin:  Without dysmetria.   Gait:  Normal station and stride.  Romberg negative.    Thank you for allowing me to take part in the care of this patient.  Shon Millet, DO  CC: ***

## 2023-05-23 ENCOUNTER — Ambulatory Visit: Payer: BLUE CROSS/BLUE SHIELD | Admitting: Neurology

## 2023-05-23 ENCOUNTER — Encounter: Payer: Self-pay | Admitting: Neurology

## 2023-05-23 DIAGNOSIS — Z029 Encounter for administrative examinations, unspecified: Secondary | ICD-10-CM

## 2024-04-07 ENCOUNTER — Ambulatory Visit
Admission: EM | Admit: 2024-04-07 | Discharge: 2024-04-07 | Disposition: A | Discharge status: Home / Self Care | Payer: Self-pay | Attending: Family Medicine | Admitting: Family Medicine

## 2024-04-07 ENCOUNTER — Other Ambulatory Visit: Payer: Self-pay

## 2024-04-07 DIAGNOSIS — R202 Paresthesia of skin: Secondary | ICD-10-CM

## 2024-04-07 NOTE — ED Triage Notes (Signed)
 Pt c/o tingling in right big toe 3 days ago. Pt states woke up Sunday and left big toe is tingling. Pt states today has tingling in left thumb and pointer finger area of hand. Pt states the sx started at 0900 today. Pt LKW [id number] today

## 2024-04-07 NOTE — ED Provider Notes (Signed)
 [facility]    CSN: [id number] Arrival date & time: 04/07/24  1308      History   Chief Complaint Chief Complaint  Patient presents with   toe tingling    HPI Johnny Santos is a 28 y.o. male presents for paresthesias.  Patient reports 3 days ago he developed intermittent paresthesias on the lateral aspects of both of his great toes.  Denies any pain or injury to the area.  He states today he developed some paresthesias to the medial aspect of his left thumb and index finger which also come and go.  At time of evaluation he states he did not feel it.  No numbness.  Denies history of diabetes, anemia, vitamin deficiencies, carpal tunnel.  Denies new shoes.  He does not have a PCP.  No other concerns at this time.  HPI  Past Medical History:  Diagnosis Date   Asthma     There are no active problems to display for this patient.   History reviewed. No pertinent surgical history.     Home Medications    Prior to Admission medications   Medication Sig Start Date End Date Taking? Authorizing Provider  albuterol  (VENTOLIN  HFA) 108 (90 Base) MCG/ACT inhaler Inhale 1-2 puffs into the lungs every 6 (six) hours as needed for wheezing or shortness of breath. 01/07/23   Deretha Ertle, Jodi R, NP  cetirizine  (ZYRTEC  ALLERGY) 10 MG tablet Take 1 tablet (10 mg total) by mouth daily. 11/23/22   Adolph Hoop, PA-C  esomeprazole  (NEXIUM ) 40 MG capsule Take 1 capsule daily at least 30 minutes before first dose of Carafate . 07/07/22   Molpus, Autry Legions, MD  fluticasone  (FLONASE ) 50 MCG/ACT nasal spray Place 2 sprays into both nostrils daily. 11/23/22   Adolph Hoop, PA-C  promethazine -dextromethorphan (PROMETHAZINE -DM) 6.25-15 MG/5ML syrup Take 5 mLs by mouth 4 (four) times daily as needed for cough. 01/07/23   Nori Poland, Jodi R, NP  pseudoephedrine  (SUDAFED) 30 MG tablet Take 30 mg by mouth every 4 (four) hours as needed for congestion.    [provider]  sucralfate  (CARAFATE ) 1 g tablet Take 1  tablet (1 g total) by mouth 4 (four) times daily -  with meals and at bedtime. 07/07/22   Molpus, Autry Legions, MD    Family History History reviewed. No pertinent family history.  Social History Social History   Tobacco Use   Smoking status: Former    Types: Cigars   Smokeless tobacco: Never  Vaping Use   Vaping status: Never Used  Substance Use Topics   Alcohol use: Yes    Comment: occ   Drug use: Never     Allergies   Pollen extract   Review of Systems Review of Systems  Skin:        Paresthesias of toes     Physical Exam Triage Vital Signs ED Triage Vitals  Encounter Vitals Group     BP 04/07/24 1320 134/87     Systolic BP Percentile --      Diastolic BP Percentile --      Pulse Rate 04/07/24 1320 90     Resp 04/07/24 1320 17     Temp 04/07/24 1320 98.4 F (36.9 C)     Temp Source 04/07/24 1320 Oral     SpO2 04/07/24 1320 96 %     Weight --      Height --      Head Circumference --      Peak Flow --  Pain Score 04/07/24 1318 0     Pain Loc --      Pain Education --      Exclude from Growth Chart --    No data found.  Updated Vital Signs BP 134/87   Pulse 90   Temp 98.4 F (36.9 C) (Oral)   Resp 17   SpO2 96%   Visual Acuity Right Eye Distance:   Left Eye Distance:   Bilateral Distance:    Right Eye Near:   Left Eye Near:    Bilateral Near:     Physical Exam Vitals and nursing note reviewed.  Constitutional:      General: He is not in acute distress.    Appearance: Normal appearance. He is not ill-appearing.  HENT:     Head: Normocephalic and atraumatic.  Eyes:     Pupils: Pupils are equal, round, and reactive to light.  Cardiovascular:     Rate and Rhythm: Normal rate.     Pulses:          Dorsalis pedis pulses are 2+ on the right side and 2+ on the left side.  Pulmonary:     Effort: Pulmonary effort is normal.  Musculoskeletal:       Hands:       Feet:     Comments: Skin to left hand is warm and dry.  There is no pain or  tenderness with palpation to the left thumb and index finger.  Cap refill +2 in all digits.  Radial pulse +2.  No tenderness to wrist.  Patient reports paresthesia to the lateral aspect of bilateral great toes.  Feet:     Right foot:     Skin integrity: Skin integrity normal.     Left foot:     Skin integrity: Skin integrity normal.     Comments: Cap refill +2 in bilateral great toes.  Skin is warm and dry.  Normal molting.  Skin:    General: Skin is warm and dry.  Neurological:     General: No focal deficit present.     Mental Status: He is alert and oriented to person, place, and time.  Psychiatric:        Mood and Affect: Mood normal.        Behavior: Behavior normal.      UC Treatments / Results  Labs (all labs ordered are listed, but only abnormal results are displayed) Labs Reviewed  BASIC METABOLIC PANEL WITH GFR  CBC  VITAMIN B12    EKG   Radiology No results found.  Procedures Procedures (including critical care time)  Medications Ordered in UC Medications - No data to display  Initial Impression / Assessment and Plan / UC Course  I have reviewed the triage vital signs and the nursing notes.  Pertinent labs & imaging results that were available during my care of the patient were reviewed by me and considered in my medical decision making (see chart for details).     Reviewed exam and symptoms with patient.  No red flags.  Discussed multiple causes of paresthesias.  Will do CBC, BMP, B12.  As patient does not have a PCP nursing staff was able to establish him with once a week and follow-up for any abnormal labs and/or persistent symptoms.  ER precautions reviewed and patient verbalized understanding. Final Clinical Impressions(s) / UC Diagnoses   Final diagnoses:  Paresthesia of foot, bilateral   Discharge Instructions   None    ED Prescriptions  None    PDMP not reviewed this encounter.   Alleen Arbour, NP 04/07/24 1356

## 2024-04-07 NOTE — Discharge Instructions (Signed)
 The clinic will contact you with results of the blood work done today if it is abnormal.  We do not call normal results.  Please follow-up with your PCP at your scheduled appointment for further workup.  Please go to the ER if you develop any worsening symptoms.  Hope you feel better soon!

## 2024-04-08 ENCOUNTER — Ambulatory Visit: Payer: Self-pay | Admitting: Nurse Practitioner

## 2024-04-08 LAB — CBC
Hematocrit: 46.3 % (ref 37.5–51.0)
Hemoglobin: 15.2 g/dL (ref 13.0–17.7)
MCH: 26.3 pg — ABNORMAL LOW (ref 26.6–33.0)
MCHC: 32.8 g/dL (ref 31.5–35.7)
MCV: 80 fL (ref 79–97)
Platelets: 327 10*3/uL (ref 150–450)
RBC: 5.78 x10E6/uL (ref 4.14–5.80)
RDW: 14.3 % (ref 11.6–15.4)
WBC: 5.8 10*3/uL (ref 3.4–10.8)

## 2024-04-08 LAB — BASIC METABOLIC PANEL WITH GFR
BUN/Creatinine Ratio: 16 (ref 9–20)
BUN: 15 mg/dL (ref 6–20)
CO2: 22 mmol/L (ref 20–29)
Calcium: 9.7 mg/dL (ref 8.7–10.2)
Chloride: 104 mmol/L (ref 96–106)
Creatinine, Ser: 0.93 mg/dL (ref 0.76–1.27)
Glucose: 94 mg/dL (ref 70–99)
Potassium: 4.4 mmol/L (ref 3.5–5.2)
Sodium: 141 mmol/L (ref 134–144)
eGFR: 115 mL/min/{1.73_m2} (ref >59)

## 2024-04-08 LAB — VITAMIN B12: Vitamin B-12: 600 pg/mL (ref 232–1245)

## 2024-04-28 ENCOUNTER — Ambulatory Visit (INDEPENDENT_AMBULATORY_CARE_PROVIDER_SITE_OTHER): Payer: Self-pay | Admitting: Physician Assistant

## 2024-04-28 VITALS — BP 127/80 | HR 92 | Ht 74.0 in | Wt 346.2 lb

## 2024-04-28 DIAGNOSIS — Z23 Encounter for immunization: Secondary | ICD-10-CM

## 2024-04-28 DIAGNOSIS — R202 Paresthesia of skin: Secondary | ICD-10-CM

## 2024-04-28 NOTE — Progress Notes (Signed)
 New patient visit   Patient: Johnny Santos   DOB: May 05, 1996   28 y.o. Male  MRN: [id number] Visit Date: 04/28/2024  Today's healthcare provider: Trenton Frock, PA-C   Cc. Paresthesias right big toe  Subjective    Johnny Santos is a 28 y.o. male who presents today as a new patient to establish care.   Discussed the use of AI scribe software for clinical note transcription with the patient, who gave verbal consent to proceed.  History of Present Illness   Johnny Santos is a 28 year old male who presents with numbness and tingling in the toes and hand.  Approximately one month ago, numbness and tingling began in the right big toe, initially thought to be work-related. Symptoms were intermittent, spreading to the left big toe by Sunday and to the hand by Monday. Basic blood work was performed at an urgent care facility.  Currently, symptoms are intermittent, occurring mainly in the morning, late at night, or in the afternoon. He did not experience symptoms this morning but did last night. Occasionally, he experiences 'pins and needles' sensations in the right big toe. Changing oversized shoes did not alleviate symptoms. There are no color changes, redness, or swelling in the feet.  Family history includes pgf with diabetes.  Past Medical History:  Diagnosis Date   Allergy    Asthma    History reviewed. No pertinent surgical history. Family Status  Relation Name Status   Father Regginald Alive   Brother Allyx Alive   MGM  (Not Specified)   PGF  (Not Specified)  No partnership data on file   Family History  Problem Relation Age of Onset   ADD / ADHD Father    ADD / ADHD Brother    Diabetes Maternal Grandmother    Diabetes Paternal Grandfather    Social History   Socioeconomic History   Marital status: Single    Spouse name: Not on file   Number of children: Not on file   Years of education: Not on file   Highest education level: 12th grade  Occupational History    Not on file  Tobacco Use   Smoking status: Former    Types: Cigars   Smokeless tobacco: Never  Vaping Use   Vaping status: Never Used  Substance and Sexual Activity   Alcohol use: Yes    Comment: occ   Drug use: Never   Sexual activity: Yes    Birth control/protection: Condom  Other Topics Concern   Not on file  Social History Narrative   Not on file   Social Drivers of Health   Financial Resource Strain: Medium Risk (04/28/2024)   Overall Financial Resource Strain (CARDIA)    Difficulty of Paying Living Expenses: Somewhat hard  Food Insecurity: No Food Insecurity (04/28/2024)   Hunger Vital Sign    Worried About Running Out of Food in the Last Year: Never true    Ran Out of Food in the Last Year: Never true  Transportation Needs: No Transportation Needs (04/28/2024)   PRAPARE - Administrator, Civil Service (Medical): No    Lack of Transportation (Non-Medical): No  Physical Activity: Insufficiently Active (04/28/2024)   Exercise Vital Sign    Days of Exercise per Week: 2 days    Minutes of Exercise per Session: 40 min  Stress: Stress Concern Present (04/28/2024)   [employer] of Occupational Health - Occupational Stress Questionnaire    Feeling of Stress : To  some extent  Social Connections: Unknown (04/28/2024)   Social Connection and Isolation Panel [NHANES]    Frequency of Communication with Friends and Family: Three times a week    Frequency of Social Gatherings with Friends and Family: Twice a week    Attends Religious Services: Patient declined    Database administrator or Organizations: No    Attends Engineer, structural: Not on file    Marital Status: Never married   Outpatient Medications Prior to Visit  Medication Sig   cetirizine  (ZYRTEC  ALLERGY) 10 MG tablet Take 1 tablet (10 mg total) by mouth daily.   esomeprazole  (NEXIUM ) 40 MG capsule Take 1 capsule daily at least 30 minutes before first dose of Carafate .   [DISCONTINUED] albuterol   (VENTOLIN  HFA) 108 (90 Base) MCG/ACT inhaler Inhale 1-2 puffs into the lungs every 6 (six) hours as needed for wheezing or shortness of breath.   [DISCONTINUED] fluticasone  (FLONASE ) 50 MCG/ACT nasal spray Place 2 sprays into both nostrils daily.   [DISCONTINUED] promethazine -dextromethorphan (PROMETHAZINE -DM) 6.25-15 MG/5ML syrup Take 5 mLs by mouth 4 (four) times daily as needed for cough.   [DISCONTINUED] pseudoephedrine  (SUDAFED) 30 MG tablet Take 30 mg by mouth every 4 (four) hours as needed for congestion.   [DISCONTINUED] sucralfate  (CARAFATE ) 1 g tablet Take 1 tablet (1 g total) by mouth 4 (four) times daily -  with meals and at bedtime.   No facility-administered medications prior to visit.   Allergies  Allergen Reactions   Pollen Extract     Immunization History  Administered Date(s) Administered   Dtap, Unspecified 03/31/1996, 05/26/1996, 08/14/1996, 04/20/1997, 04/01/2001   Fluzone Influenza virus vaccine,trivalent (IIV3), split virus 09/06/2006   HIB, Unspecified 03/31/1996, 05/26/1996, 08/14/1996, 04/20/1997   HPV Quadrivalent 06/17/2012   Hep B, Unspecified 12-31-95, 03/31/1996, 11/13/1996   Hepatitis A, Ped/Adol-2 Dose 09/06/2006, 10/31/2007   Hpv-Unspecified 09/06/2006   MMR 01/30/1997, 04/01/2001   Meningococcal Conjugate 10/31/2007, 06/17/2012   PFIZER(Purple Top)SARS-COV-2 Vaccination 02/10/2020, 03/02/2020   Polio, Unspecified 03/31/1996, 05/26/1996, 01/30/1997, 04/01/2001   Tdap 10/31/2007, 04/28/2024   Varicella 04/17/2000, 09/06/2006    Health Maintenance  Topic Date Due   HIV Screening  Never done   Hepatitis C Screening  Never done   Pneumococcal Vaccine 29-36 Years old (1 of 2 - PCV) Never done   COVID-19 Vaccine (3 - 2024-25 season) 07/29/2023   INFLUENZA VACCINE  06/27/2024   DTaP/Tdap/Td (8 - Td or Tdap) 04/28/2034   HPV VACCINES  Completed   Meningococcal B Vaccine  Aged Out    Patient Care Team: Trenton Frock, PA-C as PCP - General  (Physician Assistant)  Review of Systems  Constitutional:  Negative for fatigue and fever.  Respiratory:  Negative for cough and shortness of breath.   Cardiovascular:  Negative for chest pain, palpitations and leg swelling.  Neurological:  Positive for numbness. Negative for dizziness and headaches.        Objective    BP 127/80   Pulse 92   Ht 6\' 2"  (1.88 m)   Wt (!) 346 lb 3.2 oz (157 kg)   BMI 44.45 kg/m     Physical Exam Constitutional:      General: He is awake.     Appearance: He is well-developed.  HENT:     Head: Normocephalic.  Eyes:     Conjunctiva/sclera: Conjunctivae normal.  Cardiovascular:     Rate and Rhythm: Normal rate and regular rhythm.     Pulses:  Dorsalis pedis pulses are 3+ on the right side.       Posterior tibial pulses are 3+ on the right side.     Heart sounds: Normal heart sounds.  Pulmonary:     Effort: Pulmonary effort is normal.     Breath sounds: Normal breath sounds.  Feet:     Right foot:     Protective Sensation: 4 sites tested.  4 sites sensed.     Skin integrity: Skin integrity normal.     Toenail Condition: Right toenails are normal.     Comments: No edema or discoloration to right big toe. Full ROM Skin:    General: Skin is warm.  Neurological:     Mental Status: He is alert and oriented to person, place, and time.  Psychiatric:        Attention and Perception: Attention normal.        Mood and Affect: Mood normal.        Speech: Speech normal.        Behavior: Behavior is cooperative.     Depression Screen    04/28/2024    3:47 PM  PHQ 2/9 Scores  PHQ - 2 Score 0   No results found for any visits on 04/28/24.  Assessment & Plan     Paresthesia  Will check lfts, A1c, iron panel, uric acid. Random glucose at urgent care was normal, and normal vitamin b12.  - If blood work is normal and symptoms persist, refer to podiatry for further evaluation and possible nerve testing.  -     Hemoglobin A1c -      Uric acid -     Hepatic function panel -     IBC + Ferritin  Need for Tdap vaccination -     Tdap vaccine greater than or equal to 7yo IM  General Health Maintenance Tetanus immunization is due as the last recorded shot was in 2008. Discussed the importance of tetanus vaccination due to occupational risk and general exposure.       Return if symptoms worsen or fail to improve.      Trenton Frock, PA-C  [facility] [phone] (phone) [phone] (fax)  [facility]

## 2024-04-29 ENCOUNTER — Ambulatory Visit: Payer: Self-pay | Admitting: Physician Assistant

## 2024-04-29 DIAGNOSIS — R202 Paresthesia of skin: Secondary | ICD-10-CM

## 2024-04-29 LAB — HEPATIC FUNCTION PANEL
ALT: 26 U/L (ref 0–53)
AST: 21 U/L (ref 0–37)
Albumin: 4.7 g/dL (ref 3.5–5.2)
Alkaline Phosphatase: 49 U/L (ref 39–117)
Bilirubin, Direct: 0.2 mg/dL (ref 0.0–0.3)
Total Bilirubin: 0.8 mg/dL (ref 0.2–1.2)
Total Protein: 7.3 g/dL (ref 6.0–8.3)

## 2024-04-29 LAB — IBC + FERRITIN
Ferritin: 69.2 ng/mL (ref 22.0–322.0)
Iron: 85 ug/dL (ref 42–165)
Saturation Ratios: 22 % (ref 20.0–50.0)
TIBC: 386.4 ug/dL (ref 250.0–450.0)
Transferrin: 276 mg/dL (ref 212.0–360.0)

## 2024-04-29 LAB — HEMOGLOBIN A1C: Hgb A1c MFr Bld: 5.9 % (ref 4.6–6.5)

## 2024-04-29 LAB — URIC ACID: Uric Acid, Serum: 6.5 mg/dL (ref 4.0–7.8)

## 2025-01-06 ENCOUNTER — Encounter: Admitting: Internal Medicine
